# Patient Record
Sex: Female | Born: 1945 | Race: White | Hispanic: No | Marital: Single | Smoking: Never smoker
Health system: Southern US, Community
[De-identification: ages and names within clinical notes are randomized; demographics above are authoritative.]

## PROBLEM LIST (undated history)

## (undated) DIAGNOSIS — E785 Hyperlipidemia, unspecified: Secondary | ICD-10-CM

## (undated) DIAGNOSIS — M8580 Other specified disorders of bone density and structure, unspecified site: Secondary | ICD-10-CM

## (undated) DIAGNOSIS — E119 Type 2 diabetes mellitus without complications: Secondary | ICD-10-CM

## (undated) DIAGNOSIS — E039 Hypothyroidism, unspecified: Secondary | ICD-10-CM

## (undated) DIAGNOSIS — I10 Essential (primary) hypertension: Secondary | ICD-10-CM

## (undated) DIAGNOSIS — K219 Gastro-esophageal reflux disease without esophagitis: Secondary | ICD-10-CM

## (undated) HISTORY — PX: LAPAROSCOPIC OOPHERECTOMY: SHX6507

## (undated) HISTORY — PX: ABDOMINAL HYSTERECTOMY: SHX81

---

## 2004-07-21 ENCOUNTER — Ambulatory Visit: Payer: Self-pay | Admitting: Oncology

## 2004-11-08 ENCOUNTER — Ambulatory Visit: Payer: Self-pay | Admitting: Oncology

## 2004-11-21 ENCOUNTER — Ambulatory Visit: Payer: Self-pay | Admitting: Oncology

## 2005-05-08 ENCOUNTER — Ambulatory Visit: Payer: Self-pay | Admitting: Oncology

## 2005-05-21 ENCOUNTER — Ambulatory Visit: Payer: Self-pay | Admitting: Oncology

## 2005-07-31 ENCOUNTER — Ambulatory Visit: Payer: Self-pay | Admitting: Internal Medicine

## 2005-11-15 ENCOUNTER — Ambulatory Visit: Payer: Self-pay | Admitting: Oncology

## 2005-11-21 ENCOUNTER — Ambulatory Visit: Payer: Self-pay | Admitting: Oncology

## 2006-05-14 ENCOUNTER — Ambulatory Visit: Payer: Self-pay | Admitting: Oncology

## 2006-05-21 ENCOUNTER — Ambulatory Visit: Payer: Self-pay | Admitting: Oncology

## 2006-09-17 ENCOUNTER — Ambulatory Visit: Payer: Self-pay | Admitting: Internal Medicine

## 2006-11-12 ENCOUNTER — Ambulatory Visit: Payer: Self-pay | Admitting: Oncology

## 2006-11-21 ENCOUNTER — Ambulatory Visit: Payer: Self-pay | Admitting: Oncology

## 2007-03-30 ENCOUNTER — Ambulatory Visit: Payer: Self-pay | Admitting: Internal Medicine

## 2007-04-21 ENCOUNTER — Ambulatory Visit: Payer: Self-pay | Admitting: Oncology

## 2007-05-11 ENCOUNTER — Ambulatory Visit: Payer: Self-pay | Admitting: Oncology

## 2007-05-22 ENCOUNTER — Ambulatory Visit: Payer: Self-pay | Admitting: Oncology

## 2007-07-22 ENCOUNTER — Ambulatory Visit: Payer: Self-pay | Admitting: Oncology

## 2007-08-14 ENCOUNTER — Ambulatory Visit: Payer: Self-pay | Admitting: Oncology

## 2007-08-22 ENCOUNTER — Ambulatory Visit: Payer: Self-pay | Admitting: Oncology

## 2007-10-22 ENCOUNTER — Ambulatory Visit: Payer: Self-pay | Admitting: Oncology

## 2007-11-22 ENCOUNTER — Ambulatory Visit: Payer: Self-pay | Admitting: Oncology

## 2007-12-04 ENCOUNTER — Ambulatory Visit: Payer: Self-pay | Admitting: Oncology

## 2007-12-20 ENCOUNTER — Ambulatory Visit: Payer: Self-pay | Admitting: Oncology

## 2008-01-20 ENCOUNTER — Ambulatory Visit: Payer: Self-pay | Admitting: Oncology

## 2008-06-02 ENCOUNTER — Ambulatory Visit: Payer: Self-pay | Admitting: Oncology

## 2008-06-21 ENCOUNTER — Ambulatory Visit: Payer: Self-pay | Admitting: Oncology

## 2008-11-21 ENCOUNTER — Ambulatory Visit: Payer: Self-pay | Admitting: Oncology

## 2008-12-01 ENCOUNTER — Ambulatory Visit: Payer: Self-pay | Admitting: Oncology

## 2008-12-19 ENCOUNTER — Ambulatory Visit: Payer: Self-pay | Admitting: Oncology

## 2009-06-21 ENCOUNTER — Ambulatory Visit: Payer: Self-pay | Admitting: Oncology

## 2009-07-13 ENCOUNTER — Ambulatory Visit: Payer: Self-pay | Admitting: Oncology

## 2009-07-20 ENCOUNTER — Ambulatory Visit: Payer: Self-pay | Admitting: Oncology

## 2009-07-21 ENCOUNTER — Ambulatory Visit: Payer: Self-pay | Admitting: Oncology

## 2009-10-16 ENCOUNTER — Ambulatory Visit: Payer: Self-pay | Admitting: Internal Medicine

## 2009-12-19 ENCOUNTER — Ambulatory Visit: Payer: Self-pay | Admitting: Oncology

## 2010-01-15 ENCOUNTER — Ambulatory Visit: Payer: Self-pay | Admitting: Oncology

## 2010-01-19 ENCOUNTER — Ambulatory Visit: Payer: Self-pay | Admitting: Oncology

## 2010-07-16 ENCOUNTER — Ambulatory Visit: Payer: Self-pay | Admitting: Oncology

## 2010-07-21 ENCOUNTER — Ambulatory Visit: Payer: Self-pay | Admitting: Oncology

## 2010-07-23 ENCOUNTER — Ambulatory Visit: Payer: Self-pay | Admitting: Oncology

## 2010-07-24 LAB — CA 125: CA 125: 12.6 U/mL (ref 0.0–34.0)

## 2010-07-30 ENCOUNTER — Ambulatory Visit: Payer: Self-pay | Admitting: Oncology

## 2010-08-21 ENCOUNTER — Ambulatory Visit: Payer: Self-pay | Admitting: Oncology

## 2011-01-30 ENCOUNTER — Ambulatory Visit: Payer: Self-pay | Admitting: Surgery

## 2011-07-14 IMAGING — US ULTRASOUND RIGHT BREAST
1 series · 17 of 22 positions shown · non-contrast
Comparison: none

REASON FOR EXAM: RT ASYMMETRIC DENSITY
COMMENTS:

[Series 1: ultrasound right breast · 17 of 22 slices shown]
[im 1/22]
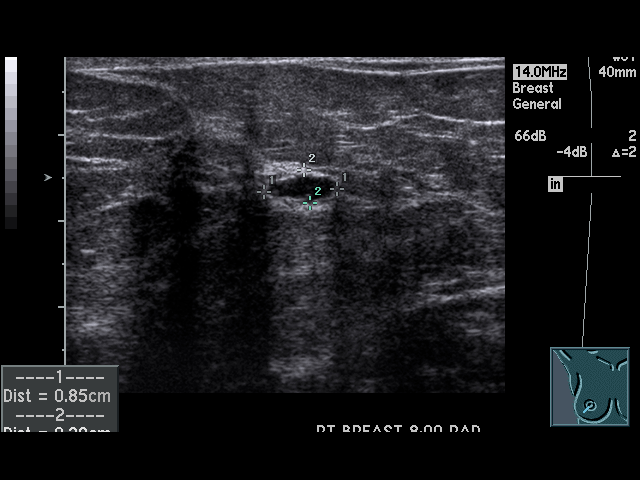
[im 2/22]
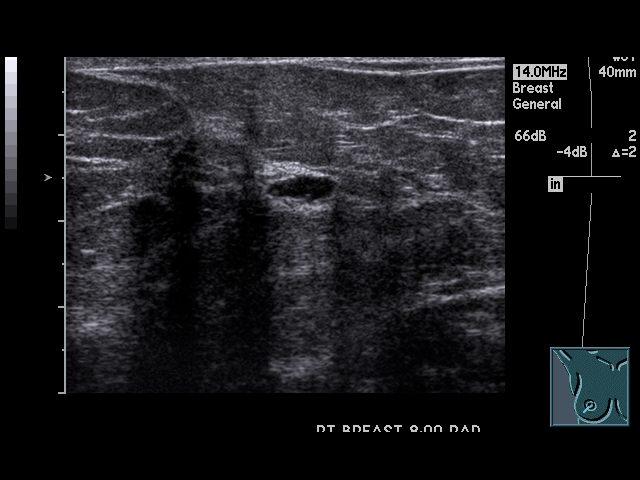
[im 4/22]
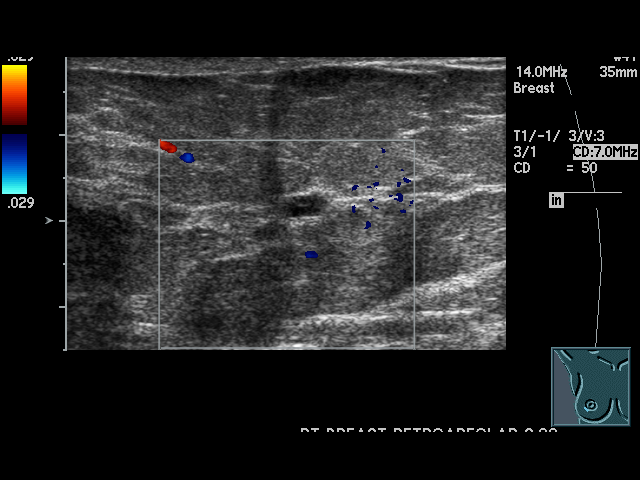
[im 5/22]
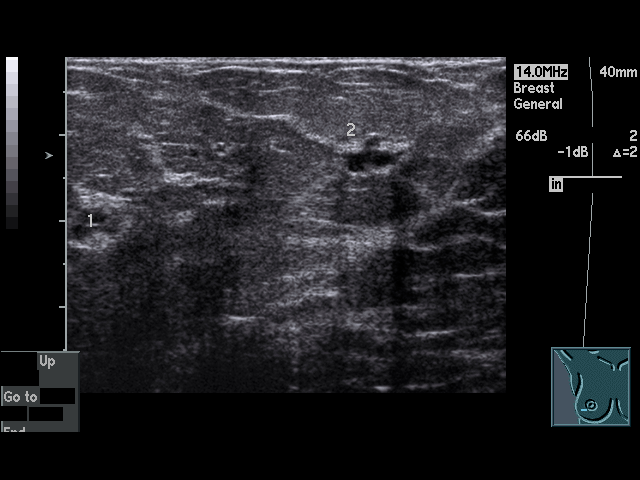
[im 6/22]
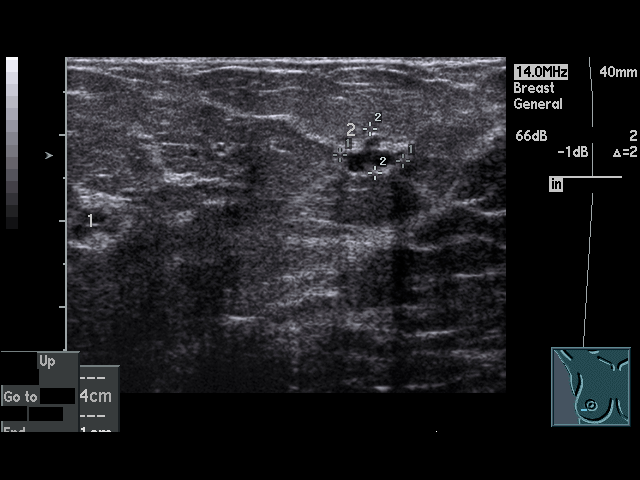
[im 8/22]
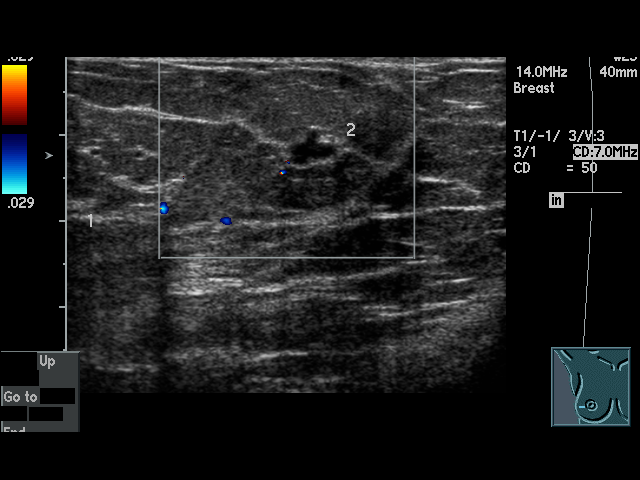
[im 9/22]
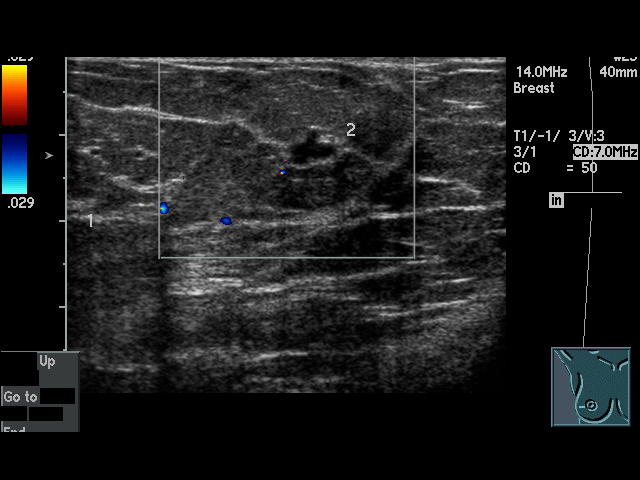
[im 10/22]
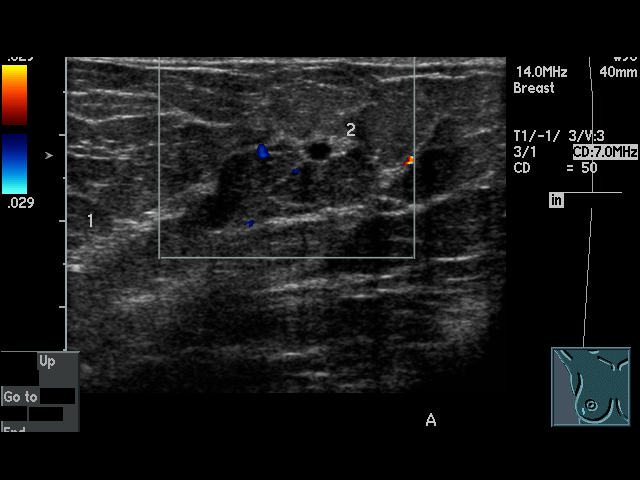
[im 12/22]
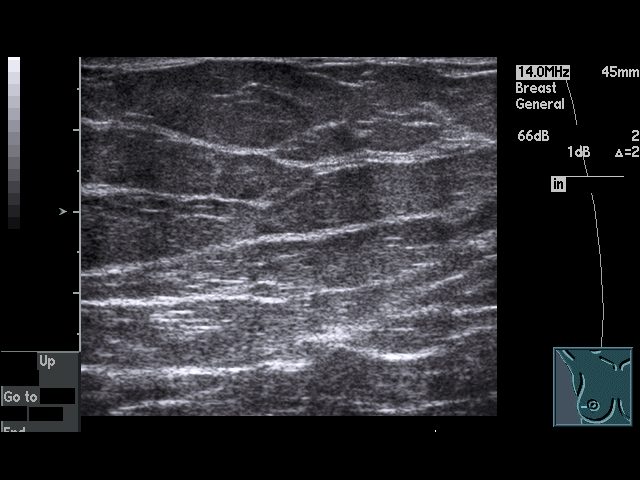
[im 13/22]
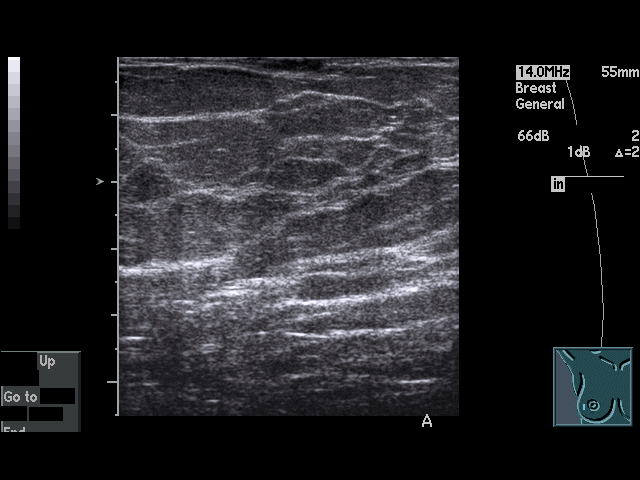
[im 14/22]
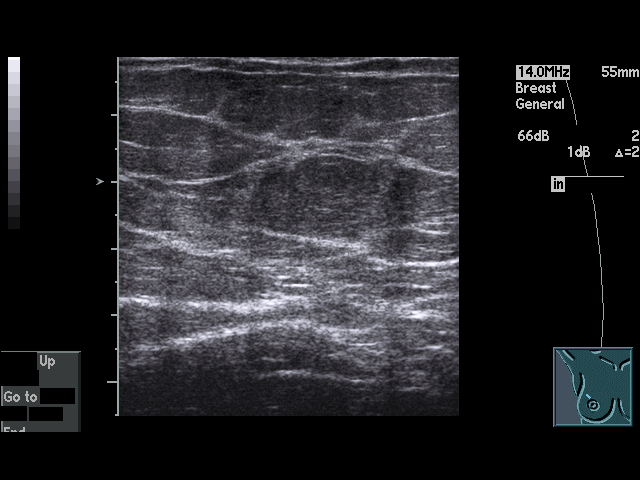
[im 15/22]
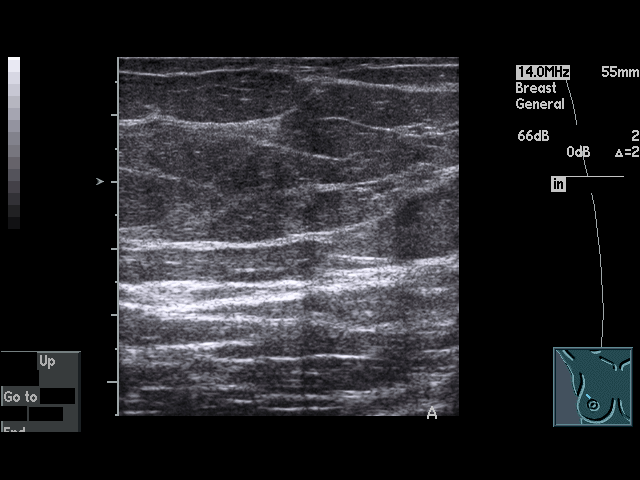
[im 17/22]
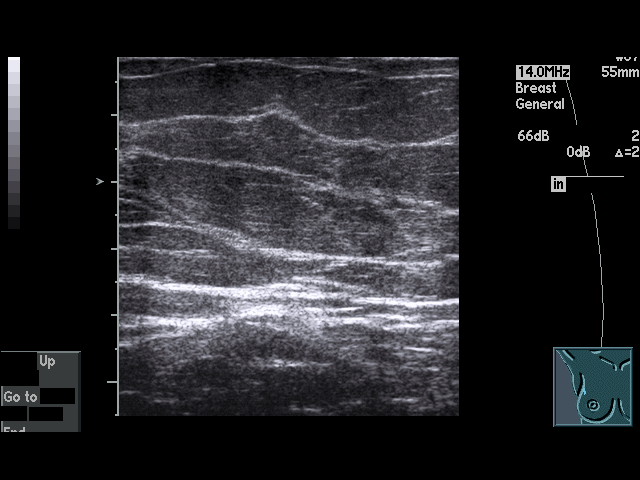
[im 18/22]
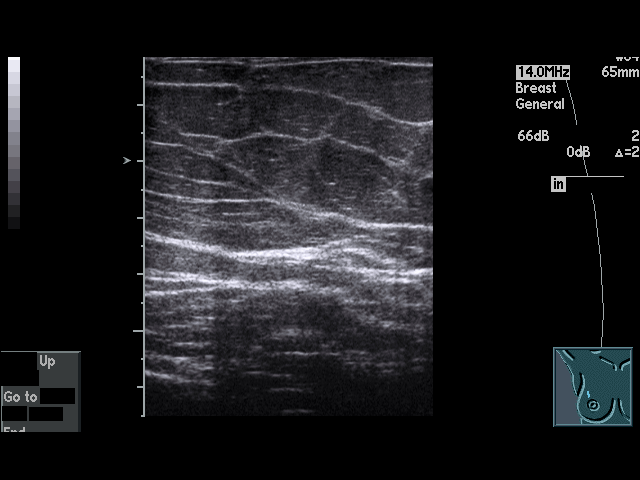
[im 19/22]
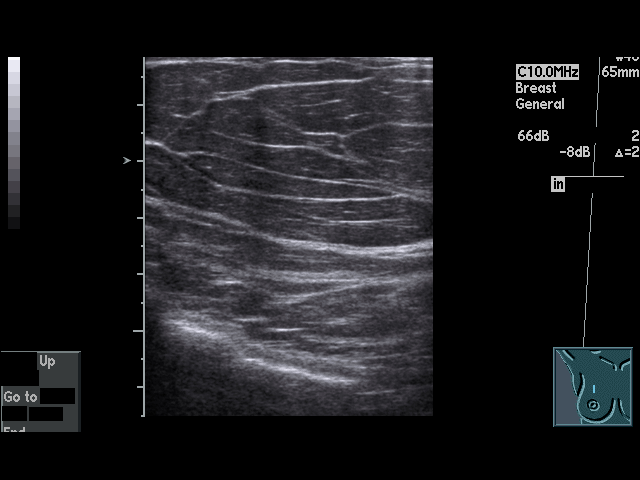
[im 21/22]
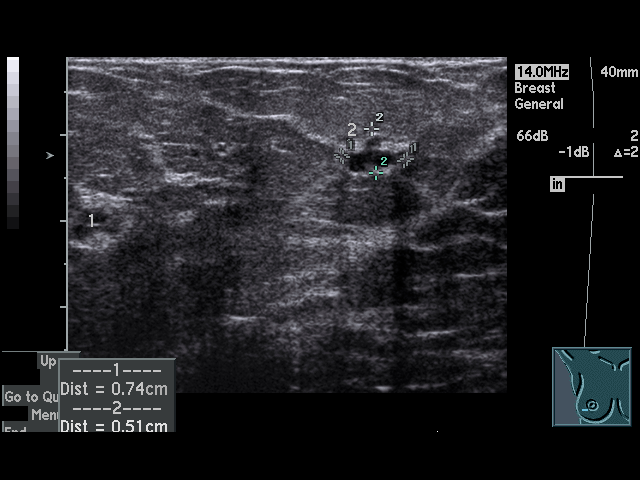
[im 22/22]
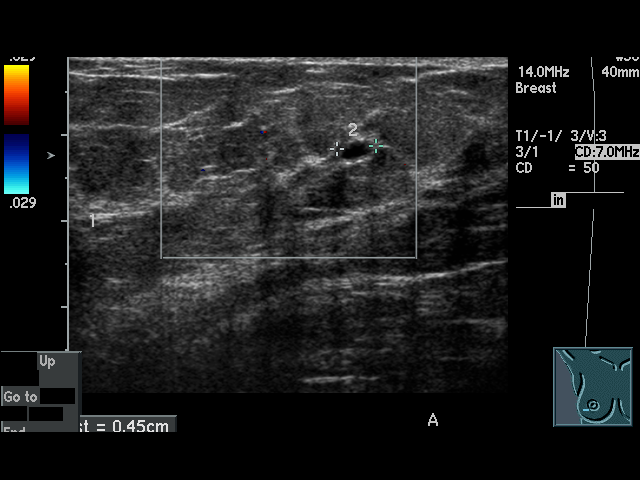

[17 of 22 positions shown; findings below may reference images not displayed]

PROCEDURE:     US  - US BREAST RIGHT  - July 30, 2010 [DATE]

RESULT:     The right breast was evaluated in the region of interest from
the 8 o'clock to the 12 o'clock position.

At the 8 o'clock position two nodular areas are appreciated. The slightly
larger area is appreciated close to proximity to the nipple and measures
0.85 x 0.39 x 0.8 cm.  This area demonstrates a primarily oval shaped with
an imperceptible wall and increased through transmission.  There are areas
of internal echoes within the nodule which is consistent with strict
criteria of a simple cyst.  A small, benign complex cyst is of diagnostic
consideration. The second cystic area is appreciated slightly in position to
the nipple and has a more complex shaped border. There is increased through
transmission though there are small foci of increased echogenicity along the
periphery. This cystic nodule has a more indeterminate appearance and
measures 0.74 x 0.45 x 0.51 cm.  This nodule is also appreciated at the 8
o'clock position.  No further solid or cystic nodules are identified in the
region of interest.
IMPRESSION: 1.Indeterminate and concerning nodule at the 8 o'clock position which
appears in a more distal location from the nipple.

2.Second nodule in closer proximity to the nipple has a more benign, likely
partially complex cystic appearance. Please refer to the additional
radiographic view dictation for completed discussion.

## 2011-07-18 ENCOUNTER — Ambulatory Visit: Payer: Self-pay | Admitting: Surgery

## 2012-03-05 ENCOUNTER — Ambulatory Visit: Payer: Self-pay | Admitting: Oncology

## 2012-03-21 ENCOUNTER — Ambulatory Visit: Payer: Self-pay | Admitting: Oncology

## 2015-01-17 ENCOUNTER — Emergency Department: Payer: Self-pay | Admitting: Emergency Medicine

## 2015-01-17 LAB — COMPREHENSIVE METABOLIC PANEL
ALT: 24 U/L
AST: 33 U/L
Albumin: 4.2 g/dL
Alkaline Phosphatase: 68 U/L
Anion Gap: 9 (ref 7–16)
BILIRUBIN TOTAL: 0.4 mg/dL
BUN: 17 mg/dL
CHLORIDE: 103 mmol/L
CREATININE: 0.81 mg/dL
Calcium, Total: 9.4 mg/dL
Co2: 26 mmol/L
EGFR (African American): >60
GLUCOSE: 140 mg/dL — AB
Potassium: 3.9 mmol/L
Sodium: 138 mmol/L
TOTAL PROTEIN: 6.9 g/dL

## 2015-01-17 LAB — URINALYSIS, COMPLETE
Bacteria: NONE SEEN
Bilirubin,UR: NEGATIVE
Blood: NEGATIVE
Glucose,UR: NEGATIVE mg/dL (ref 0–75)
Ketone: NEGATIVE
Nitrite: NEGATIVE
PH: 7 (ref 4.5–8.0)
Protein: NEGATIVE
RBC,UR: 2 /HPF (ref 0–5)
Specific Gravity: 1.021 (ref 1.003–1.030)
Squamous Epithelial: 1
WBC UR: 10 /HPF (ref 0–5)

## 2015-01-17 LAB — CBC
HCT: 40.4 % (ref 35.0–47.0)
HGB: 13.4 g/dL (ref 12.0–16.0)
MCH: 30.9 pg (ref 26.0–34.0)
MCHC: 33.2 g/dL (ref 32.0–36.0)
MCV: 93 fL (ref 80–100)
Platelet: 231 10*3/uL (ref 150–440)
RBC: 4.34 10*6/uL (ref 3.80–5.20)
RDW: 13.8 % (ref 11.5–14.5)
WBC: 9.9 10*3/uL (ref 3.6–11.0)

## 2015-01-17 LAB — LIPASE, BLOOD: Lipase: 49 U/L

## 2019-09-22 ENCOUNTER — Other Ambulatory Visit: Payer: Self-pay | Admitting: Family Medicine

## 2019-09-22 DIAGNOSIS — Z1231 Encounter for screening mammogram for malignant neoplasm of breast: Secondary | ICD-10-CM

## 2021-09-28 ENCOUNTER — Other Ambulatory Visit: Payer: Self-pay

## 2021-09-28 ENCOUNTER — Encounter: Payer: Self-pay | Admitting: Emergency Medicine

## 2021-09-28 ENCOUNTER — Emergency Department
Admission: EM | Admit: 2021-09-28 | Discharge: 2021-09-28 | Disposition: A | Discharge status: Home / Self Care | Payer: Medicare PPO | Attending: Emergency Medicine | Admitting: Emergency Medicine

## 2021-09-28 DIAGNOSIS — E039 Hypothyroidism, unspecified: Secondary | ICD-10-CM | POA: Diagnosis not present

## 2021-09-28 DIAGNOSIS — E119 Type 2 diabetes mellitus without complications: Secondary | ICD-10-CM | POA: Diagnosis not present

## 2021-09-28 DIAGNOSIS — I10 Essential (primary) hypertension: Secondary | ICD-10-CM | POA: Diagnosis not present

## 2021-09-28 DIAGNOSIS — T783XXA Angioneurotic edema, initial encounter: Secondary | ICD-10-CM | POA: Insufficient documentation

## 2021-09-28 DIAGNOSIS — R220 Localized swelling, mass and lump, head: Secondary | ICD-10-CM | POA: Diagnosis present

## 2021-09-28 HISTORY — DX: Type 2 diabetes mellitus without complications: E11.9

## 2021-09-28 HISTORY — DX: Other specified disorders of bone density and structure, unspecified site: M85.80

## 2021-09-28 HISTORY — DX: Hypothyroidism, unspecified: E03.9

## 2021-09-28 HISTORY — DX: Hyperlipidemia, unspecified: E78.5

## 2021-09-28 HISTORY — DX: Gastro-esophageal reflux disease without esophagitis: K21.9

## 2021-09-28 HISTORY — DX: Essential (primary) hypertension: I10

## 2021-09-28 NOTE — ED Provider Notes (Signed)
[facility] Emergency Department Provider Note ____________________________________________  Time seen: 1516  I have reviewed the triage vital signs and the nursing notes.  HISTORY  Chief Complaint  Oral Swelling  HPI Diamond Rogers is a 75 y.o. female with the below medical history, presents to the ED for evaluation of sudden unprovoked swelling to the lips.  She did describe some mild itching to the chin and anterior neck prior to the onset of the swelling.  She took her last dose of Macrobid yesterday for recurrent UTI, but has not had any problems with that medicine in the past.  She has also been on lisinopril-HCTZ for the last 20+ years.  She notes initially she experienced an ACE inhibitor cough, when she was started on the lisinopril in the beginning, which resolved at that she was started on the combination medication.  She denies any known food allergies or sensitivities, but did have a significant list of drug allergies to which she denies any recent exposure.  He also denies any difficulty with breathing, swallowing, or controlling oral secretions.  Past Medical History:  Diagnosis Date   Diabetes mellitus without complication (HCC)    GERD (gastroesophageal reflux disease)    Hyperlipidemia    Hypertension    Hypothyroid    Osteopenia     There are no problems to display for this patient.   Past Surgical History:  Procedure Laterality Date   ABDOMINAL HYSTERECTOMY     LAPAROSCOPIC OOPHERECTOMY      Prior to Admission medications   Not on File    Allergies Prednisone, Effexor [venlafaxine], Levaquin [levofloxacin], Omnicef [cefdinir], Paxil [paroxetine], Xanax [alprazolam], Augmentin [amoxicillin-pot clavulanate], and Codeine  History reviewed. No pertinent family history.  Social History Social History   Tobacco Use   Smoking status: Never   Smokeless tobacco: Never  Substance Use Topics   Alcohol use: Not Currently    Review of  Systems  Constitutional: Negative for fever. Eyes: Negative for visual changes. ENT: Negative for sore throat.  Lips with as above. Cardiovascular: Negative for chest pain. Respiratory: Negative for shortness of breath. Gastrointestinal: Negative for abdominal pain, vomiting and diarrhea. Genitourinary: Negative for dysuria. Musculoskeletal: Negative for back pain. Skin: Negative for rash. Neurological: Negative for headaches, focal weakness or numbness. ____________________________________________  PHYSICAL EXAM:  VITAL SIGNS: ED Triage Vitals  Enc Vitals Group     BP 09/28/21 1404 (!) 152/75     Pulse Rate 09/28/21 1404 82     Resp 09/28/21 1404 20     Temp 09/28/21 1404 98.1 F (36.7 C)     Temp Source 09/28/21 1404 Oral     SpO2 09/28/21 1404 100 %     Weight 09/28/21 1401 215 lb (97.5 kg)     Height 09/28/21 1401 5' 5.5" (1.664 m)     Head Circumference --      Peak Flow --      Pain Score 09/28/21 1401 0     Pain Loc --      Pain Edu? --      Excl. in GC? --     Constitutional: Alert and oriented. Well appearing and in no distress. Head: Normocephalic and atraumatic. Eyes: Conjunctivae are normal. PERRL. Normal extraocular movements Ears: Canals clear. TMs intact bilaterally. Nose: No congestion/rhinorrhea/epistaxis. Mouth/Throat: Mucous membranes are moist.  Uvula is midline and tonsils are flat.  No oropharyngeal lesions are appreciated.  No brawny sublingual edema is appreciated.  Patient with soft tissue swelling to  the full lower lip and the right side of the upper lip. Neck: Supple. No thyromegaly. Hematological/Lymphatic/Immunological: No cervical lymphadenopathy. Cardiovascular: Normal rate, regular rhythm. Normal distal pulses. Respiratory: Normal respiratory effort. No wheezes/rales/rhonchi. Musculoskeletal: Nontender with normal range of motion in all extremities.  Neurologic:  Normal gait without ataxia. Normal speech and language. No gross focal  neurologic deficits are appreciated. Skin:  Skin is warm, dry and intact. No rash noted. ____________________________________________    {LABS (pertinent positives/negatives)  ____________________________________________  {EKG  ____________________________________________   RADIOLOGY Official radiology report(s): No results found. ____________________________________________  PROCEDURES  Procedures ____________________________________________   INITIAL IMPRESSION / ASSESSMENT AND PLAN / ED COURSE  As part of my medical decision making, I reviewed the following data within the electronic MEDICAL RECORD NUMBER Notes from prior ED visits  DDX: angioedema, anaphylaxis, urticaria, contact dermatitis, uvulitis  Patient ED evaluation of swelling to the lips, without any known preceding trigger or contact.  Patient took the last dose of her second course of Macrobid yesterday, for the second time in 4 weeks.  Patient presents without any signs of acute anaphylaxis, respiratory distress, or airway obstruction.  Clinically the patient has swelling to the lips consistent with angioedema.  Symptoms are likely due to her ACE inhibitor at this time, patient has been advised to discontinue medication.  She will follow-up with her primary provider for ongoing blood pressure management next week.  Patient may take OTC Benadryl as needed, but symptoms will likely self resolve without antihistamine dosing.  Patient is advised to return for any concerning symptoms.  Diamond Rogers was evaluated in Emergency Department on 09/28/2021 for the symptoms described in the history of present illness. She was evaluated in the context of the global COVID-19 pandemic, which necessitated consideration that the patient might be at risk for infection with the SARS-CoV-2 virus that causes COVID-19. Institutional protocols and algorithms that pertain to the evaluation of patients at risk for COVID-19 are in a state of rapid  change based on information released by regulatory bodies including the CDC and federal and state organizations. These policies and algorithms were followed during the patient's care in the ED. ____________________________________________  FINAL CLINICAL IMPRESSION(S) / ED DIAGNOSES  Final diagnoses:  Angioedema of lips, initial encounter      Lissa Hoard, PA-C 09/28/21 Theodosia Blender, MD 09/30/21 1727

## 2021-09-28 NOTE — ED Triage Notes (Signed)
Pt comes into the ED via POV c/o her lips swelling.  Pt states that she just finished a 7 day dose of macrobid and after taking the last pill, she started having some itching as well.  Pt presents with swelling to the bottom lip as well as the right side of her top lip.  Pt does take lisinopril as well, but denies she has ever had any reactions in the past and she has been on the medication for years.  Pt has no swelling to the tongue and her speech is clear.

## 2021-09-28 NOTE — Discharge Instructions (Signed)
You have a normal exam at this time. You are experiencing angioedema (swelling of the lips) that is likely due to your blood pressure medicine. You should discontinue the medicine as this time. You should follow-up with your primary care provider for further management of your blood pressure. You may take an OTC Benadryl as needed. Return to the ED as needed.

## 2022-11-05 ENCOUNTER — Other Ambulatory Visit: Payer: Self-pay | Admitting: Otolaryngology

## 2022-11-05 DIAGNOSIS — H93A9 Pulsatile tinnitus, unspecified ear: Secondary | ICD-10-CM
